# Patient Record
Sex: Male | Born: 1950 | Race: Black or African American | Hispanic: No | Marital: Married | Smoking: Never smoker
Health system: Southern US, Community
[De-identification: ages and names within clinical notes are randomized; demographics above are authoritative.]

---

## 2005-11-13 ENCOUNTER — Ambulatory Visit: Payer: Self-pay

## 2010-02-16 ENCOUNTER — Emergency Department (HOSPITAL_COMMUNITY): Admission: EM | Admit: 2010-02-16 | Discharge: 2009-06-22 | Payer: Self-pay | Admitting: Emergency Medicine

## 2019-06-10 ENCOUNTER — Encounter (HOSPITAL_COMMUNITY): Payer: Self-pay

## 2019-06-10 ENCOUNTER — Emergency Department (HOSPITAL_BASED_OUTPATIENT_CLINIC_OR_DEPARTMENT_OTHER)
Admit: 2019-06-10 | Discharge: 2019-06-10 | Disposition: A | Discharge status: Home / Self Care | Payer: No Typology Code available for payment source | Attending: Emergency Medicine | Admitting: Emergency Medicine

## 2019-06-10 ENCOUNTER — Emergency Department (HOSPITAL_COMMUNITY): Payer: No Typology Code available for payment source

## 2019-06-10 ENCOUNTER — Other Ambulatory Visit: Payer: Self-pay

## 2019-06-10 ENCOUNTER — Emergency Department (HOSPITAL_COMMUNITY)
Admission: EM | Admit: 2019-06-10 | Discharge: 2019-06-10 | Disposition: A | Discharge status: Home / Self Care | Payer: No Typology Code available for payment source | Attending: Emergency Medicine | Admitting: Emergency Medicine

## 2019-06-10 DIAGNOSIS — Y929 Unspecified place or not applicable: Secondary | ICD-10-CM | POA: Diagnosis not present

## 2019-06-10 DIAGNOSIS — M7989 Other specified soft tissue disorders: Secondary | ICD-10-CM

## 2019-06-10 DIAGNOSIS — Y9389 Activity, other specified: Secondary | ICD-10-CM | POA: Insufficient documentation

## 2019-06-10 DIAGNOSIS — W010XXA Fall on same level from slipping, tripping and stumbling without subsequent striking against object, initial encounter: Secondary | ICD-10-CM | POA: Diagnosis not present

## 2019-06-10 DIAGNOSIS — S3991XA Unspecified injury of abdomen, initial encounter: Secondary | ICD-10-CM | POA: Diagnosis not present

## 2019-06-10 DIAGNOSIS — Y999 Unspecified external cause status: Secondary | ICD-10-CM | POA: Diagnosis not present

## 2019-06-10 DIAGNOSIS — R609 Edema, unspecified: Secondary | ICD-10-CM

## 2019-06-10 DIAGNOSIS — M79605 Pain in left leg: Secondary | ICD-10-CM | POA: Diagnosis not present

## 2019-06-10 MED ORDER — CELECOXIB 200 MG PO CAPS: 200.0000 mg | ORAL_CAPSULE | Freq: Two times a day (BID) | ORAL | 0 refills | Status: AC

## 2019-06-10 MED ORDER — IBUPROFEN 200 MG PO TABS
600.0000 mg | ORAL_TABLET | Freq: Once | ORAL | Status: AC
  Administered 2019-06-10: 600 mg via ORAL
  Filled 2019-06-10: qty 3

## 2019-06-10 NOTE — ED Notes (Deleted)
Pt repeatedly coming and standing at nurses station.  Pt encouraged to remain in his bed, eat provided breakfast.  Pt with frequent requests (juice, granola bar, mac n cheese).   Pt requesting to receive disability.  This RN informed him that he would have to speak with appropriate staff to facilitate disability paperwork.  Pt requesting to speak with Education officer, museum.  Charge RN made aware.

## 2019-06-10 NOTE — ED Notes (Signed)
Pt informed to follow up with referred Orthopedist doctor.  Pt verbalized understanding, wheeled to ED entrance.  Pt ambulatory with personal cane out of ED.

## 2019-06-10 NOTE — ED Provider Notes (Signed)
[facility]-EMERGENCY DEPT Provider Note   CSN: [id number] Arrival date & time: 06/10/19  0250     History No chief complaint on file.   Thomas Hamilton is a 69 y.o. male.  Patient to ED with pain in the left proximal LE x 5 days. He states on the day of onset, he was stepping out of his car, mistepped on the left leg and nearly fell, catching himself with the left leg. After this he developed pain and soreness that starts in the left groin extending to medial thigh and initially into the proximal posterior calf. He states the calf is no longer tender but the thigh pain continues. He was seen at Urgent Care yesterday and was referred to the ED for further evaluation. He denies testicular pain, presence of abdominal mass/hernia, numbness. He states the pain is the greatest when he attempts to straighten upright. He can walk and bear weight but has to walk bent over.  The history is provided by the patient. No language interpreter was used.       History reviewed. No pertinent past medical history.  There are no problems to display for this patient.   History reviewed. No pertinent surgical history.     History reviewed. No pertinent family history.  Social History   Tobacco Use  . Smoking status: Never Smoker  . Smokeless tobacco: Never Used  Substance Use Topics  . Alcohol use: Never  . Drug use: Never    Home Medications Prior to Admission medications   Not on File    Allergies    Patient has no allergy information on record.  Review of Systems   Review of Systems  Gastrointestinal: Negative for abdominal pain.  Genitourinary: Negative for scrotal swelling and testicular pain.  Musculoskeletal:       See HPI.  Skin: Negative for color change.  Neurological: Negative for weakness and numbness.    Physical Exam Updated Vital Signs BP (!) 162/93 (BP Location: Left Arm)   Pulse 84   Temp 98.1 F (36.7 C) (Oral)   Resp 18   Ht 5\' 7"   (1.702 m)   Wt 59 kg   SpO2 100%   BMI 20.36 kg/m   Physical Exam Vitals and nursing note reviewed.  Cardiovascular:     Rate and Rhythm: Normal rate.  Pulmonary:     Effort: Pulmonary effort is normal.  Abdominal:     Palpations: Abdomen is soft. There is no mass.     Tenderness: There is no abdominal tenderness.     Hernia: No hernia is present.  Musculoskeletal:     Comments: Left lower extremity is unremarkable in appearance. No swelling, discoloration or deformity. There is minimal tenderness of the medial aspect. Femoral and DP pulses present. No calf tenderness or swelling.   Skin:    General: Skin is warm and dry.     Findings: No bruising or erythema.  Neurological:     Mental Status: He is oriented to person, place, and time.     Sensory: No sensory deficit.     ED Results / Procedures / Treatments   Labs (all labs ordered are listed, but only abnormal results are displayed) Labs Reviewed - No data to display  EKG None  Radiology No results found. DG Hip Unilat W or Wo Pelvis 1 View Left  Result Date: 06/10/2019 CLINICAL DATA:  Left groin pain for 4 days, atraumatic EXAM: DG HIP (WITH OR WITHOUT PELVIS) 1V*L* COMPARISON:  None. FINDINGS: There is no evidence of hip fracture or dislocation. No degenerative hip narrowing or significant spurring. IMPRESSION: Negative. Electronically Signed   By: Monte Fantasia M.D.   On: 06/10/2019 07:29    Procedures Procedures (including critical care time)  Medications Ordered in ED Medications  ibuprofen (ADVIL) tablet 600 mg (has no administration in time range)    ED Course  I have reviewed the triage vital signs and the nursing notes.  Pertinent labs & imaging results that were available during my care of the patient were reviewed by me and considered in my medical decision making (see chart for details).    MDM Rules/Calculators/A&P                      Patient to ED with left thigh pain as detailed in the  HPI.   No swelling, induration or discoloration to suggest tissue rupture of the left thigh. Doubt DVT based on mechanism and physical exam. Plain film of the pelvis and doppler of left LE ordered to eval for evidence of soft tissue injury.   Patient updated on plan. All questions answered. Patient care signed out to Doreen Salvage, PA-C, pending final re-evaluation. Suspect muscle strain injury that will improve over time. Will offer ortho referral if pain does not improve over the next week.   Final Clinical Impression(s) / ED Diagnoses Final diagnoses:  None   1. Left LE pain  Rx / DC Orders ED Discharge Orders    None       Charlann Lange, PA-C 06/10/19 2683    Rolland Porter, MD 06/12/19 2300

## 2019-06-10 NOTE — Progress Notes (Signed)
Lower extremity venous has been completed.   Preliminary results in CV Proc.   Blanch Media 06/10/2019 8:22 AM

## 2019-06-10 NOTE — ED Triage Notes (Signed)
Pt was seen at Urgent Care for the same and was told to come her for further evaluation Pt complains of pain in his left groin that radiates down his thigh

## 2019-06-10 NOTE — ED Notes (Signed)
Pt comes to the desk and asks for the directors number, which was given to him, he states that he didn't like the idea of being put in a room and not told anything else I explained to him that he was told that a provider would be right with him after I gathered his triage information

## 2019-06-10 NOTE — ED Provider Notes (Signed)
Medical screening examination/treatment/procedure(s) were conducted as a shared visit with non-physician practitioner(s) and myself.  I personally evaluated the patient during the encounter.   Patient states March 27 he was walking into a tire shop and when he stepped and his left foot gave way and he states he started to fall, but caught himself, doing a partial incomplete split He states he did have pain all the way from his groin down into his calf.  He states that hurts especially if he stretches out his leg.  Patient's leg does not appear to be swollen, it is soft.  He has some tenderness in the left hip joint.  There is no skin warmth or induration of the skin.  There is no shortening or internal or external rotation of the lower extremity.      Devoria Albe, MD 06/10/19 [phone]

## 2019-06-10 NOTE — Discharge Instructions (Addendum)
Contact a health care provider if: You have increased pain or swelling in the affected area. Your symptoms are not improving or they are getting worse. 

## 2019-06-10 NOTE — ED Provider Notes (Signed)
7:21 AM BP (!) 162/93 (BP Location: Left Arm)   Pulse 84   Temp 98.1 F (36.7 C) (Oral)   Resp 18   Ht 5\' 7"  (1.702 m)   Wt 59 kg   SpO2 100%   BMI 20.36 kg/m  S/O FROM pa uPSTILL  Patient with twisting injury of the left groin with pain into the left medial thigh. Unable to stand fully. Pelvic film pending Awaiting Doppler .    f/u plan d/c with nsaid and ortho f/u.   Patient case discussed with [place] PA-C who advises f/u with ortho as planned.   Earney Hamburg, PA-C 06/11/19 0751    08/11/19, DO 06/11/19 [phone]

## 2019-06-11 ENCOUNTER — Telehealth (HOSPITAL_COMMUNITY): Payer: Self-pay | Admitting: Physician Assistant

## 2019-06-11 ENCOUNTER — Telehealth: Payer: Self-pay | Admitting: *Deleted

## 2019-06-11 MED ORDER — NAPROXEN 500 MG PO TABS: 500.0000 mg | ORAL_TABLET | Freq: Two times a day (BID) | ORAL | 0 refills | Status: AC

## 2019-06-11 NOTE — Telephone Encounter (Signed)
Pt calling to question "Grade of groin injury."  Was seen in ED yesterday, type of 'grade' was not noted. Pt hyper- verbal with angry and demeaning affect. States "I'm being bounced around, no one can communicate."  Advised to call ED or PCP for additional information. States he was directed by ED to call here. Advised multiple times NT could not give diagnosis/ information that was not noted in ED note.  Given alternate number for ED, declined stating "I know that number and I get no where."  Disjointed speech.  Requesting to speak to supervisor. Transferred to [employer], [facility] Team Leader.

## 2019-06-11 NOTE — Telephone Encounter (Signed)
Informed by secretary that rx for celebrex prescribed yesterday was over $100 out of pocket and patient is requesting a prescription for a different pain medicine to go to the [facility] on [address].  Will prescribe naproxen to be taken twice daily.

## 2021-04-15 ENCOUNTER — Other Ambulatory Visit: Payer: Self-pay

## 2021-04-15 ENCOUNTER — Emergency Department (HOSPITAL_COMMUNITY): Payer: No Typology Code available for payment source

## 2021-04-15 ENCOUNTER — Encounter (HOSPITAL_COMMUNITY): Payer: Self-pay

## 2021-04-15 ENCOUNTER — Emergency Department (HOSPITAL_COMMUNITY)
Admission: EM | Admit: 2021-04-15 | Discharge: 2021-04-15 | Disposition: A | Discharge status: Home / Self Care | Payer: No Typology Code available for payment source | Attending: Emergency Medicine | Admitting: Emergency Medicine

## 2021-04-15 DIAGNOSIS — M25552 Pain in left hip: Secondary | ICD-10-CM | POA: Diagnosis not present

## 2021-04-15 DIAGNOSIS — M7918 Myalgia, other site: Secondary | ICD-10-CM | POA: Insufficient documentation

## 2021-04-15 MED ORDER — NAPROXEN 500 MG PO TABS
500.0000 mg | ORAL_TABLET | Freq: Once | ORAL | Status: DC
  Filled 2021-04-15 (×2): qty 1

## 2021-04-15 NOTE — ED Provider Notes (Signed)
Thomas Hamilton   CSN: [id number] Arrival date & time: 04/15/21  0253     History  Chief Complaint  Patient presents with   Hip Pain   Extremity Weakness    Thomas Hamilton is a 71 y.o. male.  The history is provided by the patient.  Hip Pain This is a new problem. The current episode started more than 1 week ago. The problem occurs daily. The problem has been gradually worsening. Pertinent negatives include no chest pain and no abdominal pain. The symptoms are aggravated by walking. The symptoms are relieved by rest.  Extremity Weakness Pertinent negatives include no chest pain and no abdominal pain.  Patient reports pain left hip and left buttock.  No trauma.  Started about 2 weeks ago.  He thinks he overexerted his leg and its inflamed    Home Medications Prior to Admission medications   Medication Sig Start Date End Date Taking? Authorizing Provider  celecoxib (CELEBREX) 200 MG capsule Take 1 capsule (200 mg total) by mouth 2 (two) times daily. 06/10/19   Margarita Mail, PA-C  naproxen (NAPROSYN) 500 MG tablet Take 1 tablet (500 mg total) by mouth 2 (two) times daily with a meal. 06/11/19   Nils Flack, Mina A, PA-C      Allergies    Patient has no allergy information on record.    Review of Systems   Review of Systems  Constitutional:  Negative for fever.  Cardiovascular:  Negative for chest pain.  Gastrointestinal:  Negative for abdominal pain.  Genitourinary:        Denies incontinence  Musculoskeletal:  Positive for arthralgias, back pain and extremity weakness.  Neurological:  Negative for weakness and numbness.  All other systems reviewed and are negative.  Physical Exam Updated Vital Signs BP (!) 160/84 (BP Location: Left Arm)    Pulse (!) 111    Temp 98 F (36.7 C) (Oral)    Resp 16    SpO2 99%  Physical Exam CONSTITUTIONAL: elderly, anxious HEAD: Normocephalic/atraumatic EYES: EOMI/PERRL ENMT: Mucous membranes  moist NECK: supple no meningeal signs SPINE/BACK:entire spine nontender, No bruising/crepitance/stepoffs noted to spine CV: S1/S2 noted, no murmurs/rubs/gallops noted LUNGS: Lungs are clear to auscultation bilaterally, no apparent distress ABDOMEN: soft, nontender, no rebound or guarding GU:no cva tenderness Tenderness noted to the left buttock, but no abscess or erythema.  No perirectal abscesses noted.  Nurse chaperone present NEURO: Awake/alert, no saddle anesthesia,equal motor 5/5 strength noted with the following: hip flexion/knee flexion/extension, foot dorsi/plantar flexion, great toe extension intact bilaterally EXTREMITIES: pulses normal, full ROM tenderness of range of motion of left hip, tenderness to left buttock but no erythema/warmth/fluctuance All other extremities/joints palpated/ranged and nontender Distal pulses equal and intact SKIN: warm, color normal PSYCH: anxious  ED Results / Procedures / Treatments   Labs (all labs ordered are listed, but only abnormal results are displayed) Labs Reviewed - No data to display  EKG None  Radiology DG Lumbar Spine Complete  Result Date: 04/15/2021 CLINICAL DATA:  71 year old male with 2 weeks of pain radiating to the left hip with left leg weakness. EXAM: LUMBAR SPINE - COMPLETE 4+ VIEW COMPARISON:  None. FINDINGS: Normal lumbar segmentation. Preserved lumbar lordosis. No acute osseous abnormality identified. No pars fracture or spondylolisthesis. Visible sacrum intact. Generalized mild lumbar disc space loss. Negative visible bowel gas. IMPRESSION: No acute osseous abnormality identified in the lumbar spine. Generalized mild lumbar disc degeneration. Electronically Signed   By: Herminio Heads.D.  On: 04/15/2021 05:05   DG Hip Unilat W or Wo Pelvis 2-3 Views Left  Result Date: 04/15/2021 CLINICAL DATA:  Left hip pain and leg weakness. EXAM: DG HIP (WITH OR WITHOUT PELVIS) 2-3V LEFT COMPARISON:  None. FINDINGS: No evidence for an acute  fracture. SI joints and symphysis pubis unremarkable. Mild degenerative changes noted in both hips. AP and frog-leg lateral views of the left hip show no femoral neck fracture with a collar of small osteophytes on the femoral head. IMPRESSION: Degenerative changes noted both hips.  No acute bony abnormality. Electronically Signed   By: Misty Stanley M.D.   On: 04/15/2021 05:03    Procedures Procedures    Medications Ordered in ED Medications  naproxen (NAPROSYN) tablet 500 mg (500 mg Oral Patient Refused/Not Given 04/15/21 0349)    ED Course/ Medical Decision Making/ A&P                           Medical Decision Making Amount and/or Complexity of Data Reviewed Radiology: ordered.  Risk Prescription drug management.   Patient presents with pain in the left buttock.  There is no signs of trauma or infectious etiology.  X-rays were personally reviewed and are overall unremarkable.  Patient declined any pain medicine.  Patient can ambulate and he drove himself to the hospital.  Will refer to orthopedics patient overall appears anxious but is in otherwise no acute distress        Final Clinical Impression(s) / ED Diagnoses Final diagnoses:  Pain in left buttock    Rx / DC Orders ED Discharge Orders     None         Ripley Fraise, MD 04/15/21 [phone]

## 2021-04-15 NOTE — ED Triage Notes (Signed)
Pt complains of left hip pain and left leg weakness. Pt states that this started 2 weeks ago. He says that he overexerted his hip and it is inflamed.

## 2023-07-11 IMAGING — CR DG LUMBAR SPINE COMPLETE 4+V
5 series · 5 of 5 positions shown · non-contrast
Comparison: None.

CLINICAL DATA: 70-year-old male with 2 weeks of pain radiating to
the left hip with left leg weakness.

EXAM:
LUMBAR SPINE - COMPLETE 4+ VIEW

[t lumbar spine ap]
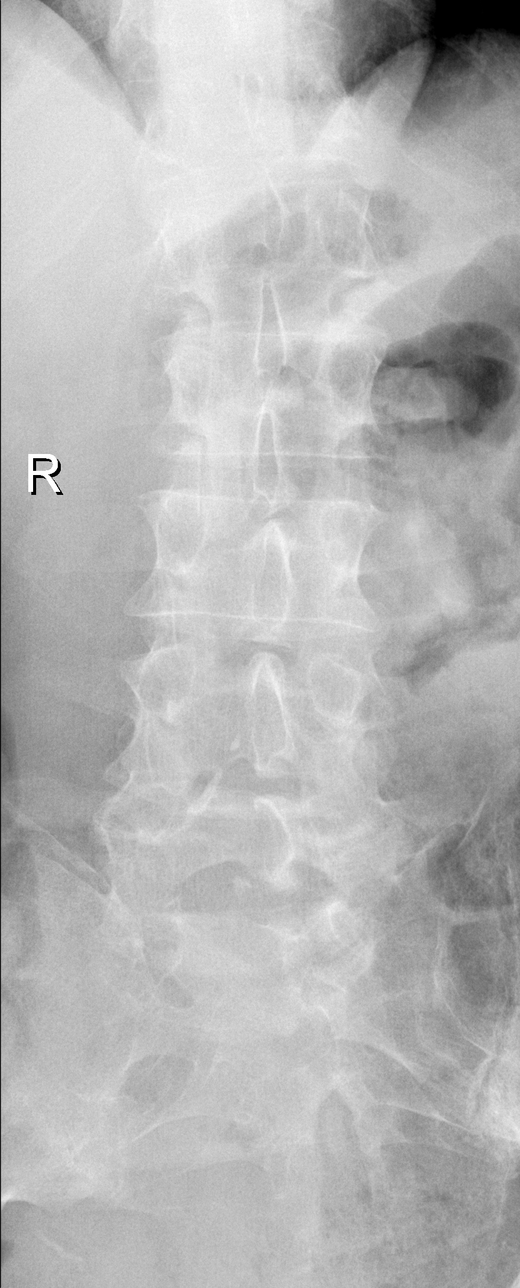

[t lumbar spine obl (1 of 2)]
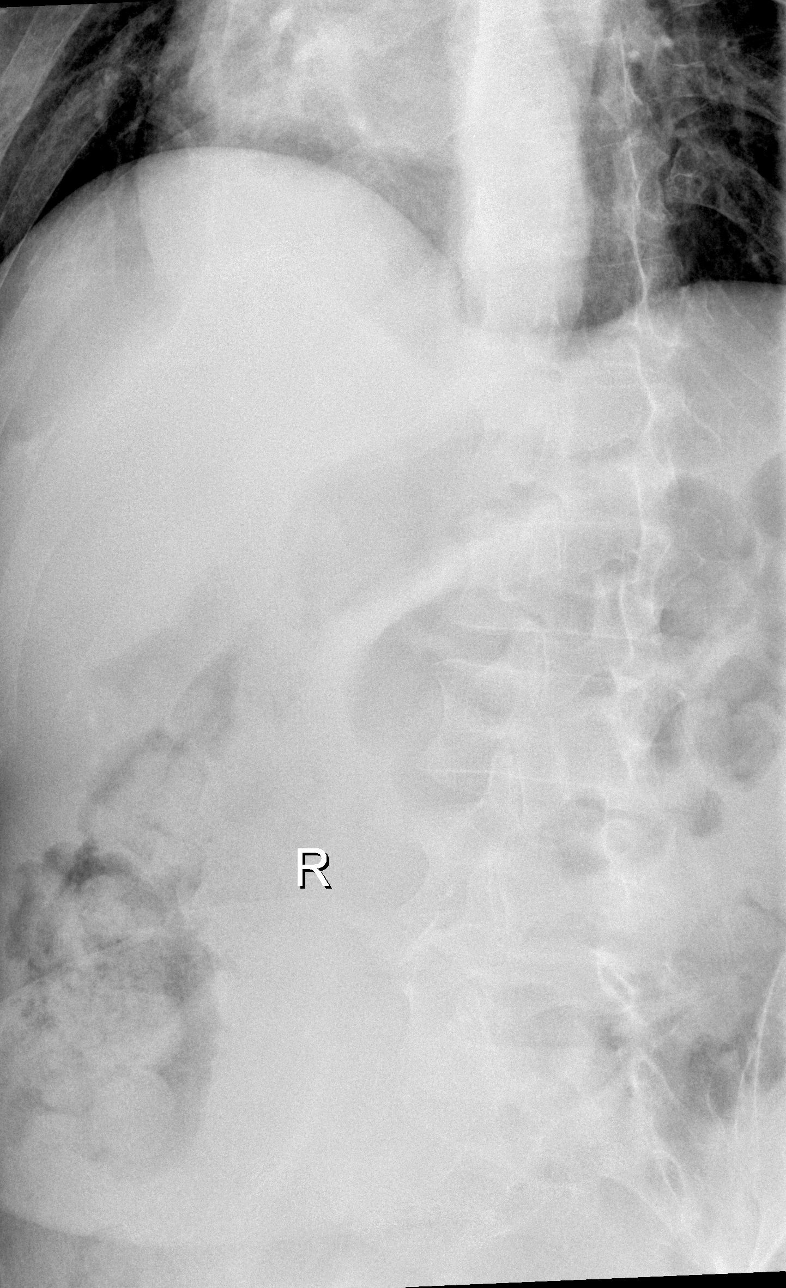

[t lumbar spine obl (2 of 2)]
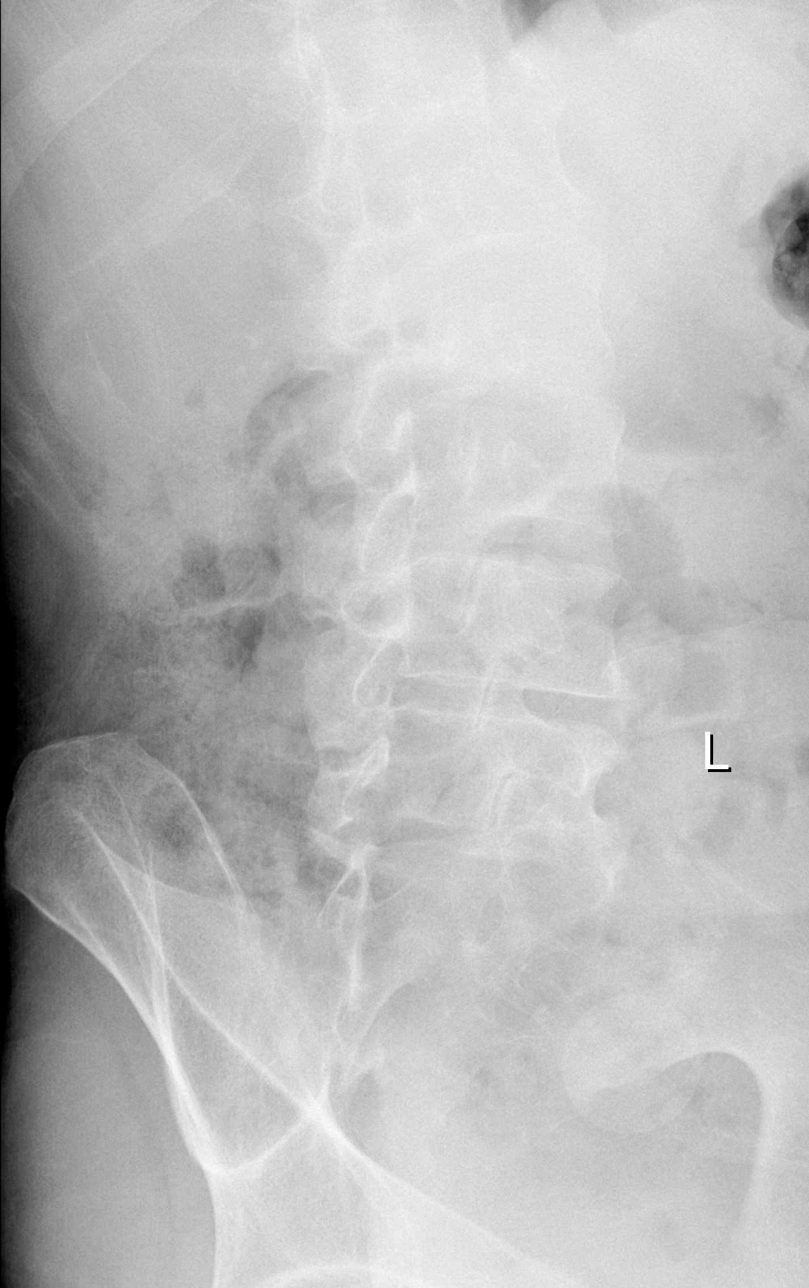

[t lumbar spine lat]
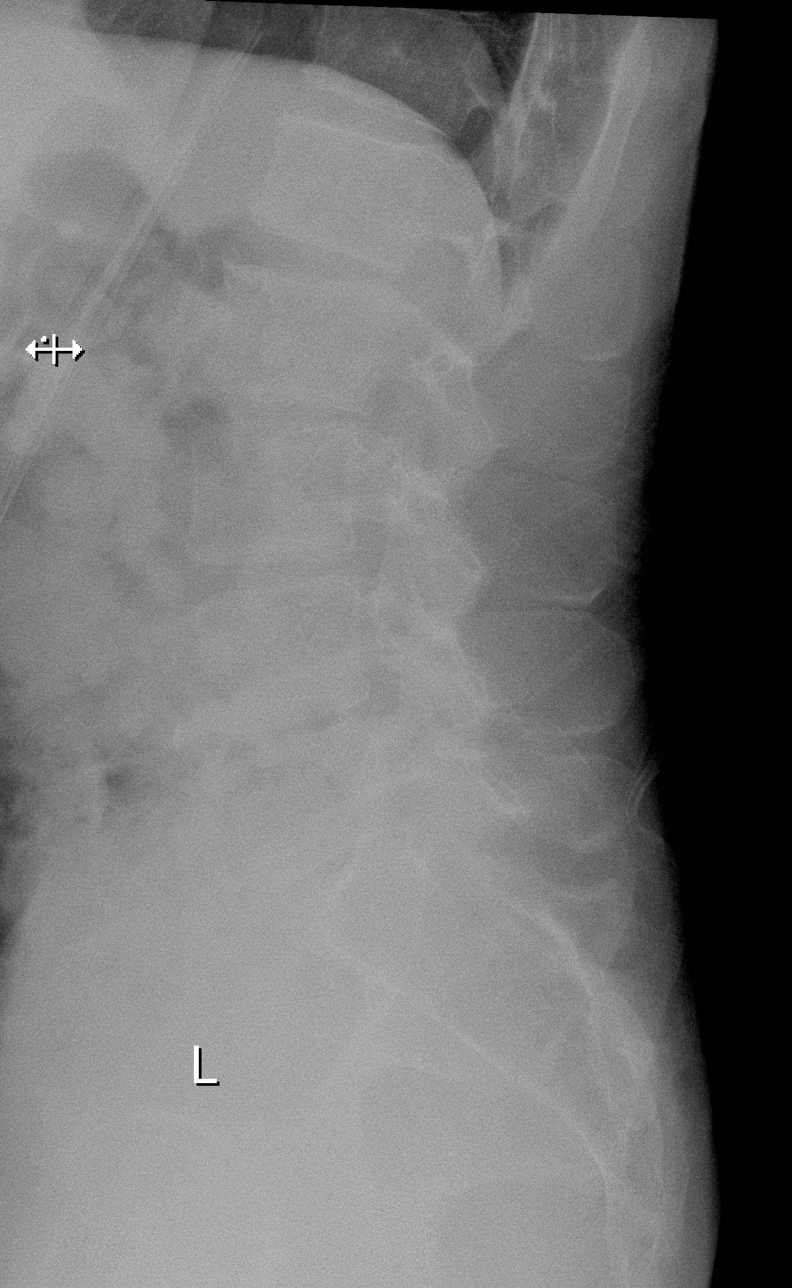

[t lumbar l-5 s-1 spot]
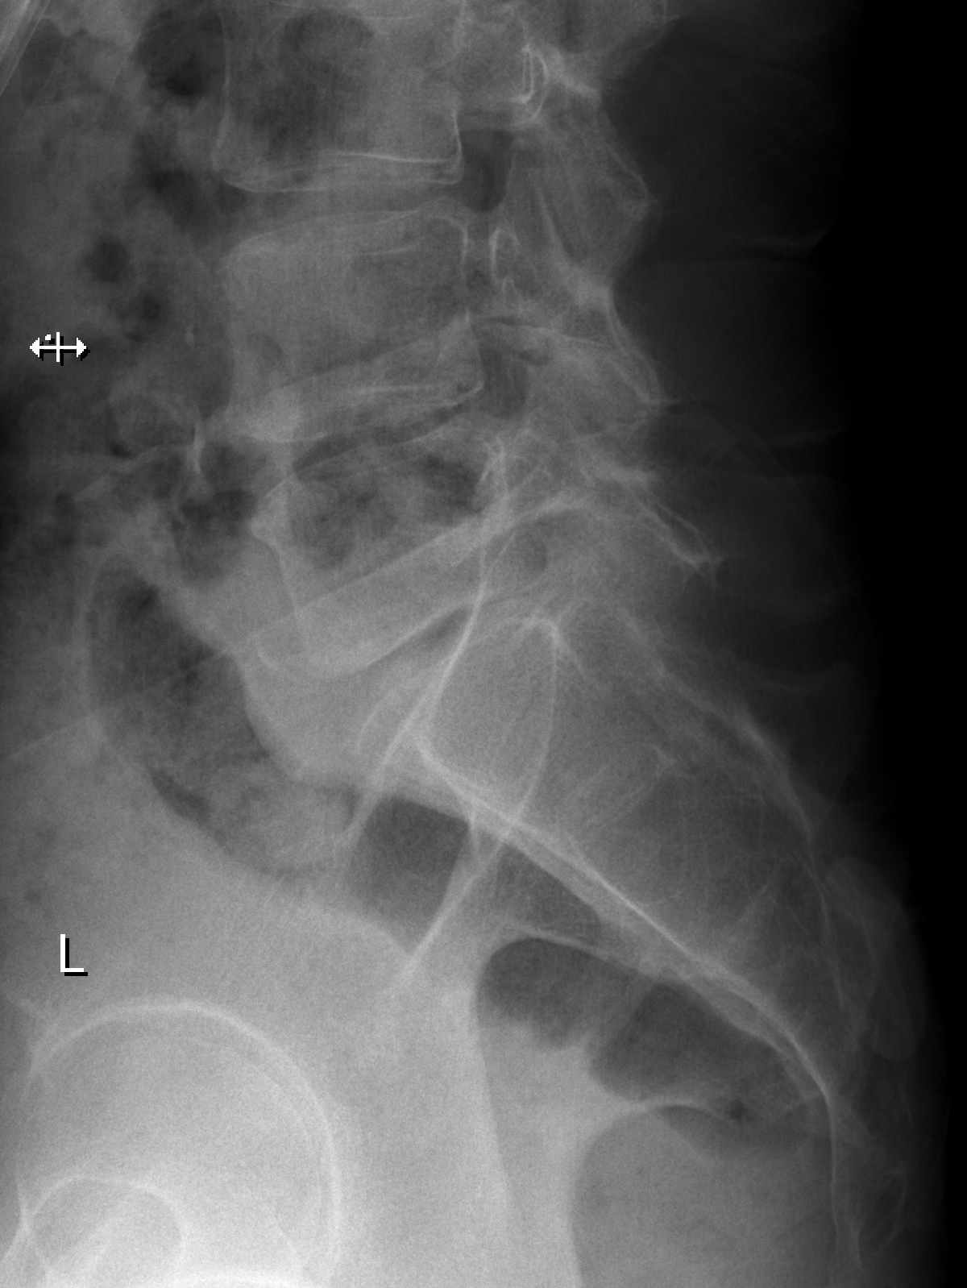

[5 of 5 positions shown; findings below may reference images not displayed]

FINDINGS: Normal lumbar segmentation. Preserved lumbar lordosis. No acute
osseous abnormality identified. No pars fracture or
spondylolisthesis. Visible sacrum intact. Generalized mild lumbar
disc space loss. Negative visible bowel gas.
IMPRESSION: No acute osseous abnormality identified in the lumbar spine.
Generalized mild lumbar disc degeneration.

## 2023-10-03 ENCOUNTER — Other Ambulatory Visit: Payer: Self-pay

## 2023-10-03 DIAGNOSIS — C61 Malignant neoplasm of prostate: Secondary | ICD-10-CM
